# Patient Record
Sex: Male | Born: 1995 | Smoking: Never smoker
Health system: Southern US, Community
[De-identification: ages and names within clinical notes are randomized; demographics above are authoritative.]

## PROBLEM LIST (undated history)

## (undated) DIAGNOSIS — T7840XA Allergy, unspecified, initial encounter: Secondary | ICD-10-CM

## (undated) HISTORY — DX: Allergy, unspecified, initial encounter: T78.40XA

---

## 2018-11-18 DIAGNOSIS — M549 Dorsalgia, unspecified: Secondary | ICD-10-CM

## 2018-11-18 HISTORY — DX: Dorsalgia, unspecified: M54.9

## 2019-09-07 ENCOUNTER — Encounter (HOSPITAL_COMMUNITY): Payer: Self-pay | Admitting: Emergency Medicine

## 2019-09-07 ENCOUNTER — Emergency Department (HOSPITAL_COMMUNITY)
Admission: EM | Admit: 2019-09-07 | Discharge: 2019-09-07 | Disposition: A | Discharge status: Home / Self Care | Payer: BLUE CROSS/BLUE SHIELD | Attending: Emergency Medicine | Admitting: Emergency Medicine

## 2019-09-07 ENCOUNTER — Other Ambulatory Visit: Payer: Self-pay

## 2019-09-07 DIAGNOSIS — M545 Low back pain, unspecified: Secondary | ICD-10-CM

## 2019-09-07 MED ORDER — ACETAMINOPHEN 500 MG PO TABS
1000.0000 mg | ORAL_TABLET | Freq: Once | ORAL | Status: AC
  Administered 2019-09-07: 1000 mg via ORAL
  Filled 2019-09-07: qty 2

## 2019-09-07 MED ORDER — KETOROLAC TROMETHAMINE 60 MG/2ML IM SOLN
15.0000 mg | Freq: Once | INTRAMUSCULAR | Status: AC
  Administered 2019-09-07: 15 mg via INTRAMUSCULAR
  Filled 2019-09-07: qty 2

## 2019-09-07 NOTE — ED Provider Notes (Signed)
[facility] EMERGENCY DEPARTMENT Provider Note   CSN: [id number] Arrival date & time: 09/07/19  4098     History   Chief Complaint Chief Complaint  Patient presents with  . Back Pain    HPI Philip Gilmore is a 23 y.o. male.     23 yo M with a cc of back pain.  Started after playing soccer 4 days ago.  Worse with bending, twisting.  Crampy pain, no radiation, no abdominal pain.  He denies overt trauma.  Denies loss of bowel or bladder denies loss of peritoneal sensation denies fever.  The history is provided by the patient.  Back Pain Location:  Lumbar spine Quality:  Aching Radiates to:  Does not radiate Pain severity:  Moderate Onset quality:  Gradual Duration:  2 days Timing:  Constant Progression:  Unchanged Chronicity:  New Relieved by:  Nothing Worsened by:  Bending, touching and twisting Ineffective treatments:  None tried Associated symptoms: no abdominal pain, no chest pain, no fever and no headaches     History reviewed. No pertinent past medical history.  There are no active problems to display for this patient.   History reviewed. No pertinent surgical history.      Home Medications    Prior to Admission medications   Not on File    Family History No family history on file.  Social History Social History   Tobacco Use  . Smoking status: Not on file  Substance Use Topics  . Alcohol use: Not on file  . Drug use: Not on file     Allergies   Patient has no allergy information on record.   Review of Systems Review of Systems  Constitutional: Negative for chills and fever.  HENT: Negative for congestion and facial swelling.   Eyes: Negative for discharge and visual disturbance.  Respiratory: Negative for shortness of breath.   Cardiovascular: Negative for chest pain and palpitations.  Gastrointestinal: Negative for abdominal pain, diarrhea and vomiting.  Musculoskeletal: Positive for back pain. Negative for  arthralgias and myalgias.  Skin: Negative for color change and rash.  Neurological: Negative for tremors, syncope and headaches.  Psychiatric/Behavioral: Negative for confusion and dysphoric mood.     Physical Exam Updated Vital Signs BP 131/64 (BP Location: Right Arm)   Pulse 67   Temp 98.2 F (36.8 C) (Oral)   Resp 16   Ht 5\' 11"  (1.803 m)   Wt 93.4 kg   SpO2 96%   BMI 28.73 kg/m   Physical Exam Vitals signs and nursing note reviewed.  Constitutional:      Appearance: He is well-developed.  HENT:     Head: Normocephalic and atraumatic.  Eyes:     Pupils: Pupils are equal, round, and reactive to light.  Neck:     Musculoskeletal: Normal range of motion and neck supple.     Vascular: No JVD.  Cardiovascular:     Rate and Rhythm: Normal rate and regular rhythm.     Heart sounds: No murmur. No friction rub. No gallop.   Pulmonary:     Effort: No respiratory distress.     Breath sounds: No wheezing.  Abdominal:     General: There is no distension.     Tenderness: There is no guarding or rebound.  Musculoskeletal: Normal range of motion.        General: Tenderness present.     Comments: ttp about the lower back.  Mild spasm pulse motor and sensation are intact to  bilateral lower extremities.  Reflexes are 2+ and equal bilaterally.  Negative straight leg raise test.  Skin:    Coloration: Skin is not pale.     Findings: No rash.  Neurological:     Mental Status: He is alert and oriented to person, place, and time.  Psychiatric:        Behavior: Behavior normal.      ED Treatments / Results  Labs (all labs ordered are listed, but only abnormal results are displayed) Labs Reviewed - No data to display  EKG None  Radiology No results found.  Procedures Procedures (including critical care time)  Medications Ordered in ED Medications  acetaminophen (TYLENOL) tablet 1,000 mg (has no administration in time range)  ketorolac (TORADOL) injection 15 mg (has no  administration in time range)     Initial Impression / Assessment and Plan / ED Course  I have reviewed the triage vital signs and the nursing notes.  Pertinent labs & imaging results that were available during my care of the patient were reviewed by me and considered in my medical decision making (see chart for details).        23 yo M with muscular back pain by history and physical.  There are no red flags.  He is well-appearing nontoxic.  Benign exam.  Ambulatory.  Treat with Tylenol and NSAIDs.  PCP follow-up.  7:25 AM:  I have discussed the diagnosis/risks/treatment options with the patient and believe the pt to be eligible for discharge home to follow-up with PCP. We also discussed returning to the ED immediately if new or worsening sx occur. We discussed the sx which are most concerning (e.g., sudden worsening pain, fever, inability to tolerate by mouth) that necessitate immediate return. Medications administered to the patient during their visit and any new prescriptions provided to the patient are listed below.  Medications given during this visit Medications  acetaminophen (TYLENOL) tablet 1,000 mg (has no administration in time range)  ketorolac (TORADOL) injection 15 mg (has no administration in time range)     The patient appears reasonably screen and/or stabilized for discharge and I doubt any other medical condition or other [facility] requiring further screening, evaluation, or treatment in the ED at this time prior to discharge.    Final Clinical Impressions(s) / ED Diagnoses   Final diagnoses:  Acute bilateral low back pain without sciatica    ED Discharge Orders    None       Melene Plan, DO 09/07/19 0725

## 2019-09-07 NOTE — ED Triage Notes (Signed)
Pt endorses lower back pain after playing soccer on Saturday.

## 2019-09-07 NOTE — Discharge Instructions (Signed)

## 2019-10-12 ENCOUNTER — Other Ambulatory Visit: Payer: Self-pay

## 2019-10-12 DIAGNOSIS — Z20822 Contact with and (suspected) exposure to covid-19: Secondary | ICD-10-CM

## 2019-10-13 LAB — NOVEL CORONAVIRUS, NAA: SARS-CoV-2, NAA: NOT DETECTED

## 2019-12-13 ENCOUNTER — Ambulatory Visit: Payer: Medicaid Other | Attending: Internal Medicine

## 2019-12-13 DIAGNOSIS — Z20822 Contact with and (suspected) exposure to covid-19: Secondary | ICD-10-CM

## 2019-12-14 LAB — NOVEL CORONAVIRUS, NAA: SARS-CoV-2, NAA: NOT DETECTED

## 2021-03-20 ENCOUNTER — Other Ambulatory Visit: Payer: Self-pay

## 2021-03-20 ENCOUNTER — Encounter: Payer: Self-pay | Admitting: Family Medicine

## 2021-03-20 ENCOUNTER — Ambulatory Visit (INDEPENDENT_AMBULATORY_CARE_PROVIDER_SITE_OTHER): Payer: Medicaid Other

## 2021-03-20 ENCOUNTER — Ambulatory Visit (INDEPENDENT_AMBULATORY_CARE_PROVIDER_SITE_OTHER): Payer: Medicaid Other | Admitting: Family Medicine

## 2021-03-20 VITALS — BP 120/80 | HR 70 | Temp 97.8°F | Resp 12 | Ht 72.5 in | Wt 197.8 lb

## 2021-03-20 DIAGNOSIS — R109 Unspecified abdominal pain: Secondary | ICD-10-CM

## 2021-03-20 DIAGNOSIS — M545 Low back pain, unspecified: Secondary | ICD-10-CM

## 2021-03-20 DIAGNOSIS — Z1159 Encounter for screening for other viral diseases: Secondary | ICD-10-CM

## 2021-03-20 DIAGNOSIS — G8929 Other chronic pain: Secondary | ICD-10-CM

## 2021-03-20 LAB — COMPREHENSIVE METABOLIC PANEL
ALT: 30 U/L (ref 0–53)
AST: 22 U/L (ref 0–37)
Albumin: 4.7 g/dL (ref 3.5–5.2)
Alkaline Phosphatase: 80 U/L (ref 39–117)
BUN: 15 mg/dL (ref 6–23)
CO2: 25 mEq/L (ref 19–32)
Calcium: 9.4 mg/dL (ref 8.4–10.5)
Chloride: 104 mEq/L (ref 96–112)
Creatinine, Ser: 1.13 mg/dL (ref 0.40–1.50)
GFR: 91.94 mL/min (ref >60.00)
Glucose, Bld: 126 mg/dL — ABNORMAL HIGH (ref 70–99)
Potassium: 4 mEq/L (ref 3.5–5.1)
Sodium: 138 mEq/L (ref 135–145)
Total Bilirubin: 0.5 mg/dL (ref 0.2–1.2)
Total Protein: 7 g/dL (ref 6.0–8.3)

## 2021-03-20 LAB — URINALYSIS, ROUTINE W REFLEX MICROSCOPIC
Bilirubin Urine: NEGATIVE
Hgb urine dipstick: NEGATIVE
Leukocytes,Ua: NEGATIVE
Nitrite: NEGATIVE
Specific Gravity, Urine: >=1.03 — AB (ref 1.000–1.030)
Total Protein, Urine: NEGATIVE
Urine Glucose: NEGATIVE
Urobilinogen, UA: 0.2 (ref 0.0–1.0)
WBC, UA: NONE SEEN (ref 0–?)
pH: 5.5 (ref 5.0–8.0)

## 2021-03-20 LAB — CBC WITH DIFFERENTIAL/PLATELET
Basophils Absolute: 0 10*3/uL (ref 0.0–0.1)
Basophils Relative: 0.5 % (ref 0.0–3.0)
Eosinophils Absolute: 0.7 10*3/uL (ref 0.0–0.7)
Eosinophils Relative: 13 % — ABNORMAL HIGH (ref 0.0–5.0)
HCT: 42.1 % (ref 39.0–52.0)
Hemoglobin: 14.6 g/dL (ref 13.0–17.0)
Lymphocytes Relative: 33.2 % (ref 12.0–46.0)
Lymphs Abs: 1.8 10*3/uL (ref 0.7–4.0)
MCHC: 34.6 g/dL (ref 30.0–36.0)
MCV: 88.3 fl (ref 78.0–100.0)
Monocytes Absolute: 0.6 10*3/uL (ref 0.1–1.0)
Monocytes Relative: 12 % (ref 3.0–12.0)
Neutro Abs: 2.2 10*3/uL (ref 1.4–7.7)
Neutrophils Relative %: 41.3 % — ABNORMAL LOW (ref 43.0–77.0)
Platelets: 245 10*3/uL (ref 150.0–400.0)
RBC: 4.76 Mil/uL (ref 4.22–5.81)
RDW: 12 % (ref 11.5–15.5)
WBC: 5.4 10*3/uL (ref 4.0–10.5)

## 2021-03-20 MED ORDER — CYCLOBENZAPRINE HCL 5 MG PO TABS: 5.0000 mg | ORAL_TABLET | Freq: Every evening | ORAL | 1 refills | Status: AC | PRN

## 2021-03-20 MED ORDER — OMEPRAZOLE 40 MG PO CPDR: 40.0000 mg | DELAYED_RELEASE_CAPSULE | Freq: Every day | ORAL | 3 refills | Status: AC

## 2021-03-20 NOTE — Progress Notes (Signed)
HPI: Philip Gilmore is a 25 y.o. male, who is here today to establish care.  Former PCP: N/A Last preventive routine visit: 7 months ago. 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  Chronic medical problems: Chronic back pain and seasonal allergies.  Today he is concerned about abdominal pain, this is a new problem. A couple weeks of periumbilical abdominal pain,intermittent. Exacerbated by food intake, any type of food. Usually 30 min after eating. No associated heartburn,nausea,and vomiting.  Passing gas sometimes helps with pain as well as defecation. Last bowel movement yesterday morning. Bowel movement 1-2 times daily. Denies fever,changes in appetite,dysphagia,changes in bowel habits, blood in stool,or melena. He has not tried OTC medications.  No unusual stress, recent travel,or illness.  Bilateral lower back pain,intermittnet,and not radiated. Problem starred about 2 years ago. No hx of trauma.  Pain is not radiated. Burning like pain, 8/10. Exacerbated by prolonged standing/walking. Alleviated by rest. Stable otherwise. Negative for saddle anesthesia,LE numbness/tingling,or bowel/bladder dysfunction. He is not taking OTC medications. He has done PT. States that he has not had imaging.  Review of Systems  Constitutional: Negative for activity change and fatigue.  HENT: Negative for mouth sores, nosebleeds, sore throat and trouble swallowing.   Eyes: Negative for redness and visual disturbance.  Respiratory: Negative for cough, shortness of breath and wheezing.   Cardiovascular: Negative for chest pain, palpitations and leg swelling.  Genitourinary: Negative for decreased urine volume, dysuria and hematuria.  Musculoskeletal: Negative for gait problem and joint swelling.  Skin: Negative for pallor and rash.  Neurological: Negative for syncope, weakness and headaches.  Hematological: Negative for adenopathy. Does not bruise/bleed  easily.  Rest see pertinent positives and negatives per HPI.  No current outpatient medications on file prior to visit.   No current facility-administered medications on file prior to visit.   Past Medical History:  Diagnosis Date  . Allergy   . Back pain 2020   He has done PT.   No Known Allergies  Family History  Problem Relation Age of Onset  . Hypertension Mother    Social History   Socioeconomic History  . Marital status: Single    Spouse name: Not on file  . Number of children: Not on file  . Years of education: Not on file  . Highest education level: Not on file  Occupational History  . Not on file  Tobacco Use  . Smoking status: Never Smoker  . Smokeless tobacco: Never Used  Substance and Sexual Activity  . Alcohol use: Not Currently  . Drug use: Never  . Sexual activity: Not Currently  Other Topics Concern  . Not on file  Social History Narrative   ** Merged History Encounter **       Social Determinants of Health   Financial Resource Strain: Not on file  Food Insecurity: Not on file  Transportation Needs: Not on file  Physical Activity: Not on file  Stress: Not on file  Social Connections: Not on file   Vitals:   03/20/21 0835  BP: 120/80  Pulse: 70  Resp: 12  Temp: 97.8 F (36.6 C)  SpO2: 96%   Body mass index is 26.46 kg/m.  Physical Exam Vitals and nursing note reviewed.  Constitutional:      General: He is not in acute distress.    Appearance: He is well-developed.  HENT:     Head: Normocephalic and atraumatic.     Mouth/Throat:  Mouth: Mucous membranes are moist.     Pharynx: Oropharynx is clear.  Eyes:     Conjunctiva/sclera: Conjunctivae normal.  Cardiovascular:     Rate and Rhythm: Normal rate and regular rhythm.     Pulses:          Dorsalis pedis pulses are 2+ on the right side and 2+ on the left side.     Heart sounds: No murmur heard.   Pulmonary:     Effort: Pulmonary effort is normal. No respiratory  distress.     Breath sounds: Normal breath sounds.  Abdominal:     Palpations: Abdomen is soft. There is no hepatomegaly or mass.     Tenderness: There is abdominal tenderness in the epigastric area. There is no right CVA tenderness, left CVA tenderness, guarding or rebound.    Musculoskeletal:     Lumbar back: No tenderness or bony tenderness. Negative right straight leg raise test and negative left straight leg raise test.  Lymphadenopathy:     Cervical: No cervical adenopathy.  Skin:    General: Skin is warm.     Findings: No erythema or rash.  Neurological:     Mental Status: He is alert and oriented to person, place, and time.     Cranial Nerves: No cranial nerve deficit.     Gait: Gait normal.     Deep Tendon Reflexes:     Reflex Scores:      Patellar reflexes are 2+ on the right side and 2+ on the left side. Psychiatric:     Comments: Well groomed, good eye contact.   ASSESSMENT AND PLAN:  Philip Gilmore was seen today for establish care.  Diagnoses and all orders for this visit: Orders Placed This Encounter  Procedures  . DG Lumbar Spine Complete  . CBC with Differential/Platelet  . Comprehensive metabolic panel  . Urinalysis, Routine w reflex microscopic  . Hepatitis C antibody   Lab Results  Component Value Date   WBC 5.4 03/20/2021   HGB 14.6 03/20/2021   HCT 42.1 03/20/2021   MCV 88.3 03/20/2021   PLT 245.0 03/20/2021   Lab Results  Component Value Date   CREATININE 1.13 03/20/2021   BUN 15 03/20/2021   NA 138 03/20/2021   K 4.0 03/20/2021   CL 104 03/20/2021   CO2 25 03/20/2021   Lab Results  Component Value Date   ALT 30 03/20/2021   AST 22 03/20/2021   ALKPHOS 80 03/20/2021   BILITOT 0.5 03/20/2021    Abdominal pain, unspecified abdominal location Hx and examination today do not suggest a serious process. I do not think imaging is needed today. Epigastric tenderness on examination, so will treat as peptic disease with PPI. Try to identify foods  that aggravate problem, avoid possible irritants. Instructed about warning signs.  -     omeprazole (PRILOSEC) 40 MG capsule; Take 1 capsule (40 mg total) by mouth daily.  Chronic bilateral low back pain without sciatica We discussed possible etiologies. Musculoskeletal most likely, his job could aggravate problem. Lumbar imaging ordered today. Flexeril at bedtime prn may help as well as topical icy hot/asper cream. Instructed about warning signs. Some side effects of medication discussed.  -     cyclobenzaprine (FLEXERIL) 5 MG tablet; Take 1 tablet (5 mg total) by mouth at bedtime as needed for muscle spasms.  Encounter for HCV screening test for low risk patient -     Hepatitis C antibody   Return in about 3 months (around  06/20/2021) for Abdominal pain,back pain.   Zohaib Heeney G. [place], MD  [facility]. [place] office.   A few things to remember from today's visit:   Abdominal pain, unspecified abdominal location - Plan: CBC with Differential/Platelet, Comprehensive metabolic panel, Urinalysis, Routine w reflex microscopic  Chronic bilateral low back pain without sciatica - Plan: DG Lumbar Spine Complete  Encounter for HCV screening test for low risk patient - Plan: Hepatitis C antibody  If you need refills please call your pharmacy. Do not use My Chart to request refills or for acute issues that need immediate attention.   Avoid grease and spicy food. We can try Omeprazole 30 min before breakfast for 4 weeks.  For your back, this seems to be a chronic problem, so you need to be careful with posture and injuries. You can take a muscle relaxant at bedtime as needed. Topical icy hot or asper cream may also help.  Please be sure medication list is accurate. If a new problem present, please set up appointment sooner than planned today.

## 2021-03-20 NOTE — Patient Instructions (Addendum)
A few things to remember from today's visit:   Abdominal pain, unspecified abdominal location - Plan: CBC with Differential/Platelet, Comprehensive metabolic panel, Urinalysis, Routine w reflex microscopic  Chronic bilateral low back pain without sciatica - Plan: DG Lumbar Spine Complete  Encounter for HCV screening test for low risk patient - Plan: Hepatitis C antibody  If you need refills please call your pharmacy. Do not use My Chart to request refills or for acute issues that need immediate attention.   Avoid grease and spicy food. We can try Omeprazole 30 min before breakfast for 4 weeks.  For your back, this seems to be a chronic problem, so you need to be careful with posture and injuries. You can take a muscle relaxant at bedtime as needed. Topical icy hot or asper cream may also help.  Please be sure medication list is accurate. If a new problem present, please set up appointment sooner than planned today.

## 2021-03-21 LAB — HEPATITIS C ANTIBODY
Hepatitis C Ab: NONREACTIVE
SIGNAL TO CUT-OFF: 0.03 (ref <1.00)

## 2021-03-27 ENCOUNTER — Encounter: Payer: Self-pay | Admitting: Family Medicine

## 2021-06-19 NOTE — Progress Notes (Deleted)
    HPI:  [name] is a 25 y.o. male, who is here today to follow on recent OV/ED visit.  Review of Systems Rest see pertinent positives and negatives per HPI.  Current Outpatient Medications on File Prior to Visit  Medication Sig Dispense Refill   cyclobenzaprine (FLEXERIL) 5 MG tablet Take 1 tablet (5 mg total) by mouth at bedtime as needed for muscle spasms. 30 tablet 1   omeprazole (PRILOSEC) 40 MG capsule Take 1 capsule (40 mg total) by mouth daily. 30 capsule 3   No current facility-administered medications on file prior to visit.    Past Medical History:  Diagnosis Date   Allergy    Back pain 2020   He has done PT.   No Known Allergies  Social History   Socioeconomic History   Marital status: Single    Spouse name: Not on file   Number of children: Not on file   Years of education: Not on file   Highest education level: Not on file  Occupational History   Not on file  Tobacco Use   Smoking status: Never   Smokeless tobacco: Never  Substance and Sexual Activity   Alcohol use: Not Currently   Drug use: Never   Sexual activity: Not Currently  Other Topics Concern   Not on file  Social History Narrative   ** Merged History Encounter **       Social Determinants of Health   Financial Resource Strain: Not on file  Food Insecurity: Not on file  Transportation Needs: Not on file  Physical Activity: Not on file  Stress: Not on file  Social Connections: Not on file    There were no vitals filed for this visit. There is no height or weight on file to calculate BMI.  Physical Exam  ASSESSMENT AND PLAN:   There are no diagnoses linked to this encounter.  No orders of the defined types were placed in this encounter.   No problem-specific Assessment & Plan notes found for this encounter.   No follow-ups on file.   Betty G. [place], MD  [facility]. [place] office.

## 2021-06-20 ENCOUNTER — Ambulatory Visit: Payer: 59 | Admitting: Family Medicine

## 2022-10-29 IMAGING — DX DG LUMBAR SPINE COMPLETE 4+V
5 series · 5 of 5 positions shown · non-contrast
Comparison: None

CLINICAL DATA: Low back pain for 2 years.

EXAM:
LUMBAR SPINE - COMPLETE 4+ VIEW

[lumbar spine ap]
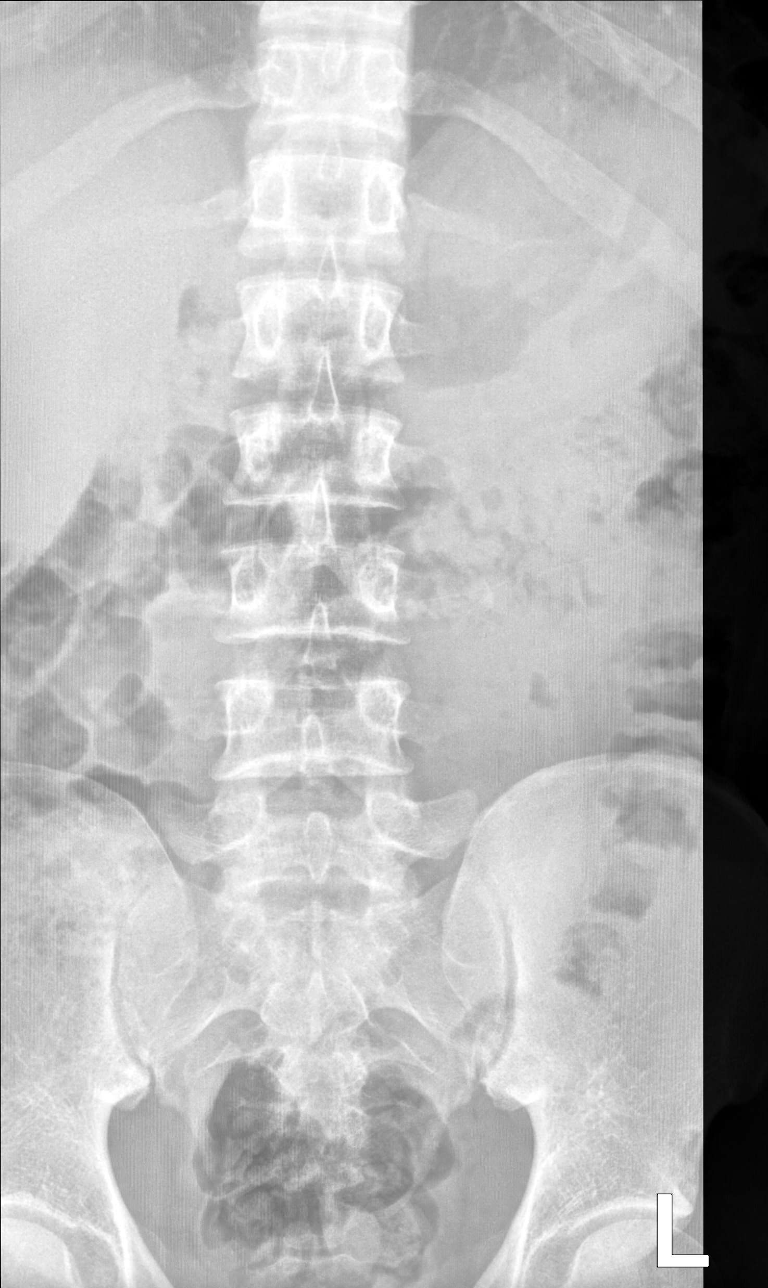

[lumbar spine oblique (1 of 2)]
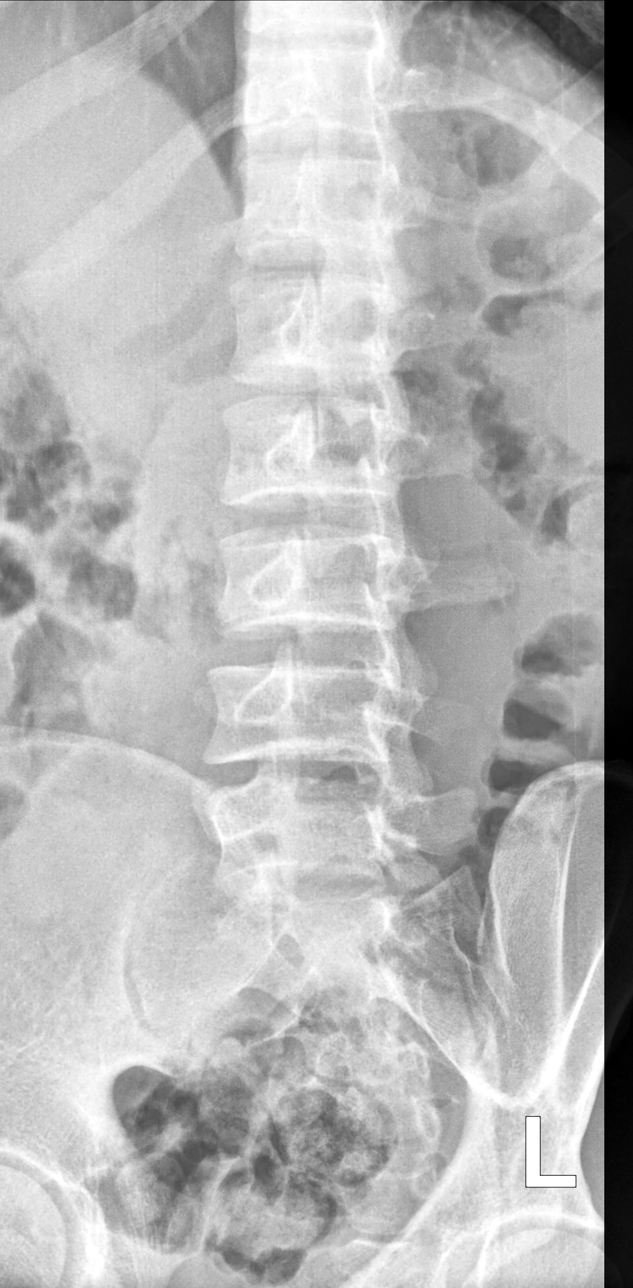

[lumbar spine oblique (2 of 2)]
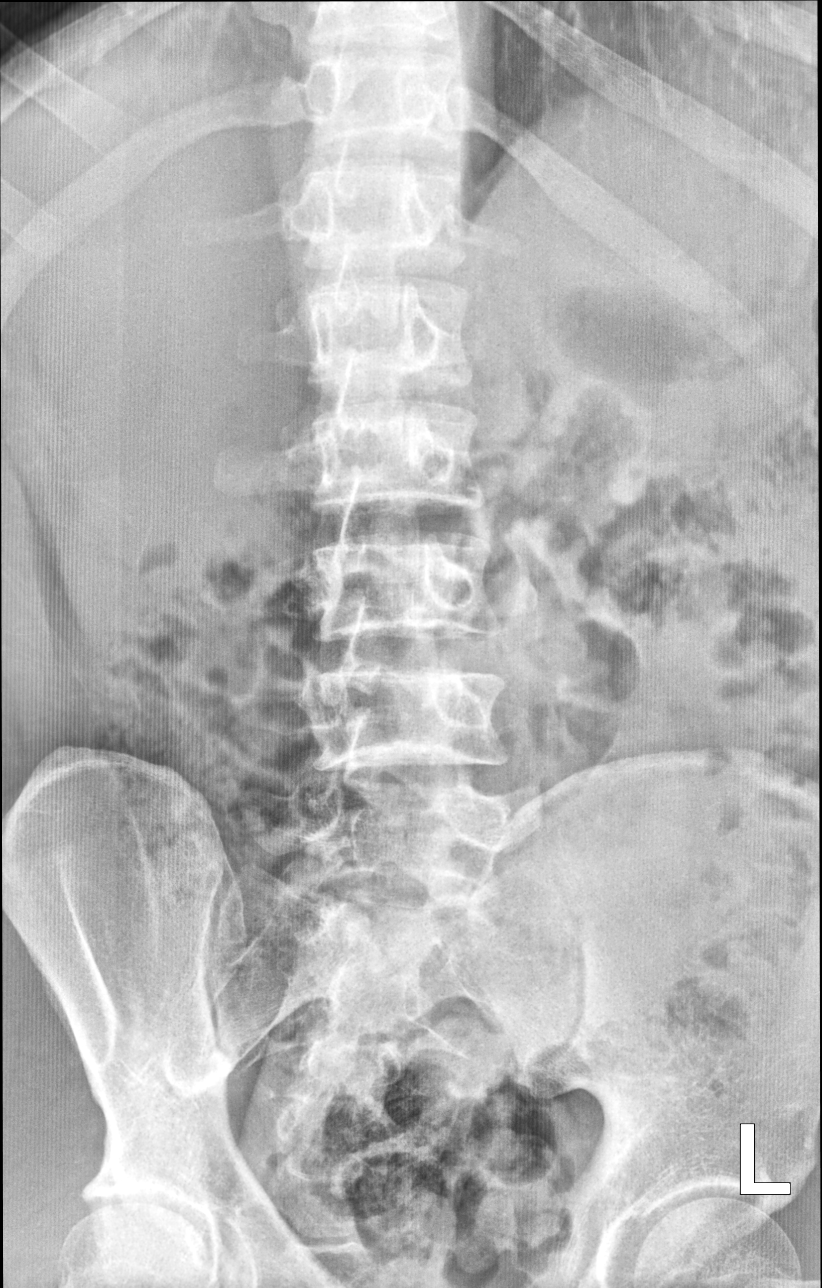

[lumbar spine lat]
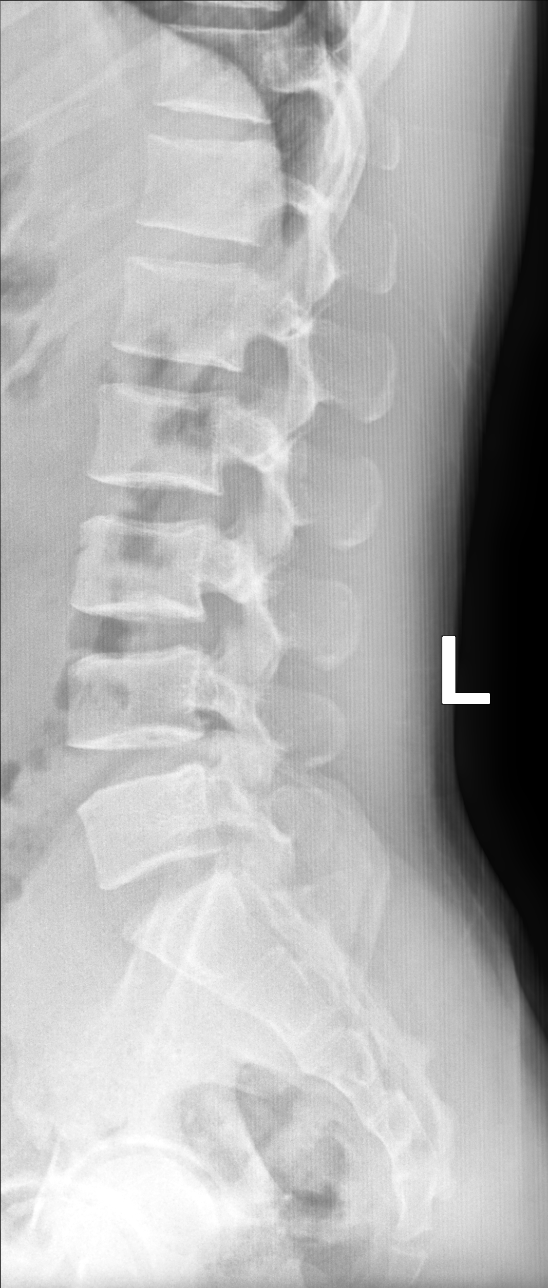

[lumbar spine lat spot]
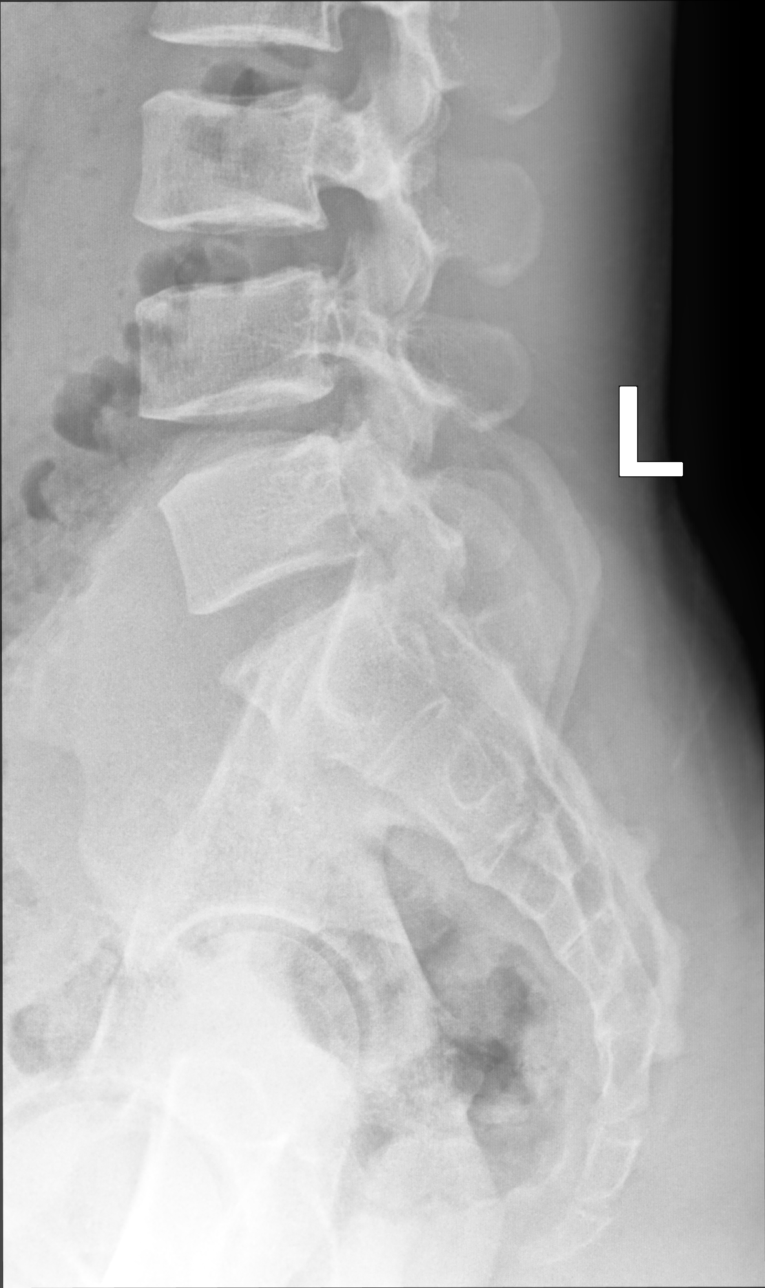

[5 of 5 positions shown; findings below may reference images not displayed]

FINDINGS: There is no evidence of lumbar spine fracture. Alignment is normal.
Intervertebral disc spaces are maintained.
IMPRESSION: Negative.

## 2024-12-06 ENCOUNTER — Encounter: Admitting: Family Medicine

## 2024-12-31 ENCOUNTER — Encounter: Admitting: Family Medicine

## 2025-01-13 ENCOUNTER — Ambulatory Visit: Admitting: Family Medicine
